# Patient Record
Sex: Female | Born: 1960 | Race: Black or African American | Hispanic: No | Marital: Married | Smoking: Never smoker
Health system: Southern US, Community
[De-identification: ages and names within clinical notes are randomized; demographics above are authoritative.]

## PROBLEM LIST (undated history)

## (undated) DIAGNOSIS — J45909 Unspecified asthma, uncomplicated: Secondary | ICD-10-CM

## (undated) DIAGNOSIS — K635 Polyp of colon: Secondary | ICD-10-CM

## (undated) DIAGNOSIS — C801 Malignant (primary) neoplasm, unspecified: Secondary | ICD-10-CM

## (undated) DIAGNOSIS — E559 Vitamin D deficiency, unspecified: Secondary | ICD-10-CM

## (undated) DIAGNOSIS — H209 Unspecified iridocyclitis: Secondary | ICD-10-CM

## (undated) HISTORY — PX: KNEE SURGERY: SHX244

## (undated) HISTORY — PX: ABDOMINAL HYSTERECTOMY: SHX81

## (undated) HISTORY — DX: Unspecified asthma, uncomplicated: J45.909

## (undated) HISTORY — DX: Unspecified iridocyclitis: H20.9

## (undated) HISTORY — DX: Polyp of colon: K63.5

## (undated) HISTORY — DX: Malignant (primary) neoplasm, unspecified: C80.1

## (undated) HISTORY — DX: Vitamin D deficiency, unspecified: E55.9

---

## 2000-11-12 ENCOUNTER — Other Ambulatory Visit: Admission: RE | Admit: 2000-11-12 | Discharge: 2000-11-12 | Payer: Self-pay | Admitting: Obstetrics and Gynecology

## 2001-08-29 ENCOUNTER — Encounter: Admission: RE | Admit: 2001-08-29 | Discharge: 2001-08-29 | Payer: Self-pay | Admitting: Urology

## 2001-08-29 ENCOUNTER — Encounter: Payer: Self-pay | Admitting: Urology

## 2003-07-28 ENCOUNTER — Other Ambulatory Visit: Admission: RE | Admit: 2003-07-28 | Discharge: 2003-07-28 | Payer: Self-pay | Admitting: Obstetrics and Gynecology

## 2004-04-14 ENCOUNTER — Encounter (INDEPENDENT_AMBULATORY_CARE_PROVIDER_SITE_OTHER): Payer: Self-pay | Admitting: Specialist

## 2004-04-15 ENCOUNTER — Inpatient Hospital Stay (HOSPITAL_COMMUNITY): Admission: RE | Admit: 2004-04-15 | Discharge: 2004-04-17 | Payer: Self-pay | Admitting: Obstetrics and Gynecology

## 2004-10-11 ENCOUNTER — Ambulatory Visit (HOSPITAL_COMMUNITY): Admission: RE | Admit: 2004-10-11 | Discharge: 2004-10-11 | Payer: Self-pay | Admitting: Obstetrics and Gynecology

## 2005-07-05 ENCOUNTER — Other Ambulatory Visit: Admission: RE | Admit: 2005-07-05 | Discharge: 2005-07-05 | Payer: Self-pay | Admitting: Obstetrics and Gynecology

## 2005-11-21 ENCOUNTER — Ambulatory Visit (HOSPITAL_COMMUNITY): Admission: RE | Admit: 2005-11-21 | Discharge: 2005-11-21 | Payer: Self-pay | Admitting: Obstetrics and Gynecology

## 2005-12-13 ENCOUNTER — Encounter: Admission: RE | Admit: 2005-12-13 | Discharge: 2005-12-13 | Payer: Self-pay | Admitting: Obstetrics and Gynecology

## 2006-07-30 ENCOUNTER — Encounter: Admission: RE | Admit: 2006-07-30 | Discharge: 2006-07-30 | Payer: Self-pay | Admitting: Obstetrics and Gynecology

## 2006-07-31 ENCOUNTER — Encounter (INDEPENDENT_AMBULATORY_CARE_PROVIDER_SITE_OTHER): Payer: Self-pay | Admitting: Specialist

## 2006-07-31 ENCOUNTER — Encounter: Admission: RE | Admit: 2006-07-31 | Discharge: 2006-07-31 | Payer: Self-pay | Admitting: Obstetrics and Gynecology

## 2006-08-22 ENCOUNTER — Encounter: Admission: RE | Admit: 2006-08-22 | Discharge: 2006-08-22 | Payer: Self-pay | Admitting: General Surgery

## 2006-08-22 ENCOUNTER — Encounter (INDEPENDENT_AMBULATORY_CARE_PROVIDER_SITE_OTHER): Payer: Self-pay | Admitting: Specialist

## 2006-08-22 ENCOUNTER — Ambulatory Visit (HOSPITAL_BASED_OUTPATIENT_CLINIC_OR_DEPARTMENT_OTHER): Admission: RE | Admit: 2006-08-22 | Discharge: 2006-08-22 | Payer: Self-pay | Admitting: General Surgery

## 2006-09-05 ENCOUNTER — Other Ambulatory Visit: Admission: RE | Admit: 2006-09-05 | Discharge: 2006-09-05 | Payer: Self-pay | Admitting: Obstetrics and Gynecology

## 2007-06-19 ENCOUNTER — Encounter: Admission: RE | Admit: 2007-06-19 | Discharge: 2007-06-19 | Payer: Self-pay | Admitting: Obstetrics and Gynecology

## 2008-07-27 ENCOUNTER — Ambulatory Visit (HOSPITAL_COMMUNITY): Admission: RE | Admit: 2008-07-27 | Discharge: 2008-07-27 | Payer: Self-pay | Admitting: Obstetrics and Gynecology

## 2009-07-29 ENCOUNTER — Ambulatory Visit (HOSPITAL_COMMUNITY): Admission: RE | Admit: 2009-07-29 | Discharge: 2009-07-29 | Payer: Self-pay | Admitting: Obstetrics and Gynecology

## 2010-09-20 ENCOUNTER — Ambulatory Visit (HOSPITAL_COMMUNITY): Admission: RE | Admit: 2010-09-20 | Discharge: 2010-09-20 | Payer: Self-pay | Admitting: Obstetrics and Gynecology

## 2011-01-09 ENCOUNTER — Encounter: Payer: Self-pay | Admitting: Obstetrics and Gynecology

## 2011-05-05 NOTE — H&P (Signed)
Danielle Blackburn, Danielle Blackburn                          ACCOUNT NO.:  [id number]   MEDICAL RECORD NO.:  [id number]                   PATIENT TYPE:  INP   LOCATION:  NA                                   FACILITY:  [facility]   PHYSICIAN:  Janine Limbo, M.D.            DATE OF BIRTH:  [birth year]   DATE OF ADMISSION:  04/14/2005  DATE OF DISCHARGE:                                HISTORY & PHYSICAL   HISTORY OF PRESENT ILLNESS:  Danielle Blackburn is a 50 year old female, para 2-0-1-2,  who presents for a vaginal hysterectomy. The patient complains of increasing  dysmenorrhea that is not relieved by oral contraceptives nor nonsteroidal  anti-inflammatory agents. The patient has had an ultrasound performed that  showed a 9-cm fundal fibroid. She was also found to have a 1.4-cm fibroid  present on the posterior fundus. Her ovaries appeared normal. The patient  denies irregular bleeding of any sort. She is ready to proceed with  definitive therapy. She also desires permanent sterilization. The patient's  last menstrual period was April 09, 2004. Her most recent Pap smear was  within normal limits and it was from August 2004.   OBSTETRIC HISTORY:  The patient has had two term vaginal deliveries and one  first trimester miscarriage.   DRUG ALLERGIES:  No known drug allergies.   PAST MEDICAL HISTORY:  The patient has a distant history of asthma and she  uses an albuterol inhaler when she needs it. She has not been hospitalized  for many, many years. She did say she had pneumonia as a 50 year old. She  had her wisdom teeth removed many years ago. She also had surgery on her  knee many years ago. She had a dilatation and curettage associated with the  first trimester pregnancy loss.   SOCIAL HISTORY:  The patient denies cigarette use, alcohol use, and  recreational drug use.   REVIEW OF SYSTEMS:  Please see history of present illness.   FAMILY HISTORY:  The patient's father died from congestive heart  failure.  Her mother had hypertension. The patient has two sisters with hypertension  and one sister with diabetes. She has one brother with hypertension, one  brother with asthma, and one brother that died from heart disease.   PHYSICAL EXAMINATION:  VITAL SIGNS: Weight is 192 pounds.  HEENT: Within normal limits.  CHEST: Clear.  HEART: Regular rate and rhythm.  BREASTS: Without masses.  ABDOMEN: Nontender.  EXTREMITIES: Grossly normal.  NEUROLOGIC: Grossly normal.  PELVIC EXAM: External genitalia is normal. The vagina is normal except for a  small cystocele and rectocele. The uterus is 8-10 week's size. The cervix  descends one-half the length of the vagina. The adnexa are without masses.  Rectovaginal exam confirms.   ASSESSMENT:  1. Fibroid uterus.  2. Dysmenorrhea unresponsive to birth control pills and nonsteroidal anti-     inflammatory agents.  3. Uterine descensus.  PLAN:  The patient will undergo a vaginal hysterectomy. She understands the  indications for her procedure as well as her alternative treatments. She  accepts the risks of, but not limited to anesthetic complications, bleeding,  infection, and possible damage to the surrounding organs.                                               Janine Limbo, M.D.    AVS/MEDQ  D:  04/10/2004  T:  04/10/2004  Job:  [id number]

## 2011-05-05 NOTE — Op Note (Signed)
Danielle Blackburn, Danielle Blackburn                ACCOUNT NO.:  [id number]   MEDICAL RECORD NO.:  [id number]          PATIENT TYPE:  AMB   LOCATION:  DSC                          FACILITY:  [facility]   PHYSICIAN:  Leonie Man, M.D.   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   POSTOPERATIVE DIAGNOSIS:  Atypical ductal hyperplasia, left breast, rule out  carcinoma, pathology pending.   PROCEDURE:  Needle localization with lumpectomy, left breast.   SURGEON:  Leonie Man, M.D.   ASSISTANT:  OR nurse.   ANESTHESIA:  General.   TISSUES TO THE LAB:  1. Breast tissue with localizing wire.  2. Additional lateral margin.  3. Additional medial margin.   ESTIMATED BLOOD LOSS:  Minimal.   COMPLICATIONS:  None.   DISPOSITION:  The patient returned to the PACU in excellent condition.   NOTE:  [name] is a 50 year old female on routine mammogram noted to have  an area of pleomorphic calcifications in the right breast.  This was  biopsied and noted to have atypical ductal hyperplasia, without any evidence  of carcinoma in situ.  The patient comes to the operating room now for more  precise excision of this are of ADH.  After the risks and potential benefits  of surgery had been discussed, all questions were answered and consent  obtained.   DESCRIPTION OF PROCEDURE:  The patient was positioned supinely.  Following  the induction of satisfactory general anesthesia, the left breast was  prepped and draped and included in a sterile operative field.  A  circumareolar incision was carried down through the skin, subcutaneous  tissue and a nipple flap raised medially to follow the course of the  localizing needle.  The localizing needle was brought into the wound.  There  was some thick scarring underneath the nipple, and  this was dissected down  upon.  A core of breast tissue approximately 6 cm in length was taken out  from around the localizing needle, and this was forwarded for radiologic  evaluation.  On initial radiologic evaluation, the clip was not seen.  I  then returned to the incision and took additional medial and lateral wall,  labeled #2 and 3, for additional evaluation.  Hemostasis was then obtained  with electrocautery.  Sponge and instrument counts verified.  I did not  think it necessary to simply continue taking out breast tissue in view of  this benign diagnosis, and I will wait for the permanent sections, at such  time as to whether or not there is any evidence of DCIS within this area.  The wounds were then closed in layers as follows.  The breast tissue was  reapproximated with 2-0  Vicryl sutures.  The subcutaneous tissue was closed with 3-0 Vicryl.  The  skin was closed with 5-0 Vicryl, and then reinforced with Steri-Strips.  A  sterile compressive dressing was applied, the anesthetic reversed, and the  patient removed from the operating room to the recovery room in stable  condition.  She tolerated the procedure well.      Leonie Man, M.D.  Electronically Signed     PB/MEDQ  D:  08/22/2006  T:  08/22/2006  Job:  [id number]   cc:   Leonie Man, MD

## 2011-05-05 NOTE — Op Note (Signed)
Danielle Blackburn, Danielle Blackburn                          ACCOUNT NO.:  [id number]   MEDICAL RECORD NO.:  [id number]                   PATIENT TYPE:  OBV   LOCATION:  9305                                 FACILITY:  [facility]   PHYSICIAN:  Janine Limbo, M.D.            DATE OF BIRTH:  September 09, 1961   DATE OF PROCEDURE:  04/14/2004  DATE OF DISCHARGE:                                 OPERATIVE REPORT   PREOPERATIVE DIAGNOSIS:  1. Fibroid uterus.  2. Dysmenorrhea.  3. Uterine descensus.   POSTOPERATIVE DIAGNOSIS:  1. Fibroid uterus.  2. Dysmenorrhea.  3. Uterine descensus.   PROCEDURE:  1. Vaginal hysterectomy.  2. Uterine morcellation.  3. Cystoscopy.   SURGEON:  Janine Limbo, M.D.   FIRST ASSISTANT:   ANESTHESIA:  General.   DISPOSITION:  Danielle Blackburn is a 50 year old female who presents with a fibroid  uterus and dysmenorrhea.  Her dysmenorrhea has been unresponsive to  nonsteroidal anti-inflammatory agents and to oral contraceptives.  She  understands the indications for her procedure and she accepts the risks of,  but not limited to, anesthetic complications, bleeding, infection, and  possible damage to surrounding organs.   FINDINGS:  The patient had a 14-15 weeks size fibroid uterus.  The fallopian  tubes and the ovaries appeared normal.  Cystoscopy was performed at the end  of the procedure because of the extensive uterine morcellation that was  required and because sutures were required for hemostasis at the sidewall.  The patient was noted to have patent ureters bilaterally.   PROCEDURE:  The patient was taken to the operating room where a general  anesthetic was given.  The patient's abdomen, perineum, and vagina were  prepped with multiple areas of Betadine.  A Foley catheter was placed in the  bladder.  The patient was sterilely draped.  Examination under anesthesia  was performed.  The cervix was then injected with 30 mL of a dilute solution  of Pitressin and saline.   A circumferential incision was made around the  cervix.  The mucosa was advanced anteriorly and posteriorly.  The posterior  cul-de-sac and then the anterior cul-de-sac were entered.  Alternating from  right to left, the uterosacral ligaments, paracervical tissues, parametrial  tissues, and the uterine arteries were clamped, cut, sutured, and tied  securely.  Attempts were made to invert the uterus through the posterior  colpotomy, but these attempts were unsuccessful.  After multiple attempts we  then began to morcellate the uterus so that it could be removed through the  vagina.  Brisk bleeding was encountered.  Large sections of myometrium and  fibroids were removed.  We were able to finally invert the uterus through  the posterior colpotomy.  The upper pedicles were secured using clamps.  The  uterus was transected from the operative field.  Bleeding was noted on the  sidewalls.  Hemostasis was achieved using figure-of-eight sutures.  Care was  taken not to damage the vital structures of the pelvic sidewall.  Once we  were finally able to obtain hemostasis, the sutures attached to the  uterosacral ligaments were brought out through the vaginal angles and tied  securely.  A McCall culdoplasty suture was placed in the posterior cul-de-  sac incorporating the uterosacral ligaments bilaterally and the posterior  peritoneum.  A final check was made for hemostasis and, again, hemostasis  was confirmed.  The vaginal cuff was then closed using figure-of-eight  sutures incorporating the anterior vaginal mucosa, the anterior peritoneum,  the posterior peritoneum, and the posterior vaginal mucosa.  The McCall  culdoplasty suture was tied securely and the apex of the vagina was noted to  elevate into the mid pelvis.  The Foley catheter was removed and the  diagnostic hysteroscope was inserted.  The patient was given methylene blue.  We then identified the ureteral orifices.  Peristalsis was noted at  the  ureteral orifices bilaterally and fluid was noted to pass through both  ureteral openings.  The bladder mucosa was carefully inspected and there  were no lesions present.  No sutures were identified.  The cystoscope was  removed and the Foley catheter was again reinserted.  The vagina was then  packed using gauze that had been prepped with Estrogen cream.  The patient  was awakened from her anesthetic and taken to the recovery room in stable  condition.  0 Vicryl was the suture material used throughout the procedure.  Sponge, needle, and instrument counts were correct on two occasions.  The  patient tolerated the procedure well.  The patient was noted to drain clear  urine that was slightly tinged with blue dye.  The estimated blood loss was  1650 mL.                                               Janine Limbo, M.D.    AVS/MEDQ  D:  04/14/2004  T:  04/14/2004  Job:  [id number]

## 2011-05-05 NOTE — Discharge Summary (Signed)
Danielle Blackburn, Danielle Blackburn                          ACCOUNT NO.:  [id number]   MEDICAL RECORD NO.:  [id number]                   PATIENT TYPE:  INP   LOCATION:  9305                                 FACILITY:  [facility]   PHYSICIAN:  Janine Limbo, M.D.            DATE OF BIRTH:  01-14-1961   DATE OF ADMISSION:  04/14/2004  DATE OF DISCHARGE:  04/17/2004                                 DISCHARGE SUMMARY   DISCHARGE DIAGNOSES:  1. Fibroid uterus.  2. Dysmenorrhea unresponsive to oral contraceptives and nonsteroidal     antiinflammatory medication.  3. Uterine descensus.   OPERATION:  On the date of admission the patient underwent a total vaginal  hysterectomy with uterine morcellation and cystoscopy, tolerating all  procedures well.  The patient was found to have a fibroid uterus which  weighed 487 g with normal-appearing tubes and ovaries and patent ureters  bilaterally.   HISTORY OF PRESENT ILLNESS:  Danielle Blackburn is a 50 year old female para 2-0-1-2  who presents for vaginal hysterectomy because of symptomatic uterine  fibroids.  Please see the patient's dictated History and Physical  Examination for details.   PHYSICAL EXAMINATION:  VITAL SIGNS:  Weight is 192 pounds.  GENERAL:  Within normal limits.  PELVIC:  External genitalia is normal.  Vagina is normal except for a small  cystocele and rectocele.  The uterus is 8- to 10-week size.  The cervix  descends over one-half the length of the vagina.  Adnexa are without masses  and rectovaginal exam confirms.   HOSPITAL COURSE:  On the date of admission the patient underwent  aforementioned procedures, tolerating them all well.  Postoperative course  was marked by a spike in temperature to 100.8 degrees Fahrenheit orally.  However, the patient quickly defervesced and remained afebrile for 24 hours  prior to discharge.  Postoperative hemoglobin (immediately) was 9.0, and  postoperative day #1 was 8.3.  The patient's preoperative hemoglobin  was  12.2.  The patient was offered transfusion for her mildly symptomatic  anemia; however, she declined.  By postoperative day #4 the patient had  resumed bowel and bladder function and was therefore deemed ready for  discharge home.   DISCHARGE MEDICATIONS:  1. Phenergan 25 mg one tablet q.6h. as needed for nausea.  2. Ibuprofen 600 mg one tablet with food q.6h. for 5 days then as needed for     pain.  3. Colace 100 mg one tablet twice daily until bowel movements are regular.  4. Iron 325 mg one tablet twice daily for 6 weeks.  5. Vicodin one to two tablets q.4-6h. as needed for pain.   FOLLOW-UP:  The patient is scheduled for a 6 weeks postoperative visit with  Dr. Stefano Gaul on May 26, 2004 at 10:30 a.m.   DISCHARGE INSTRUCTIONS:  1. The patient was given a copy of [facility] postoperative     instruction sheet.  2. She was  further advised to avoid driving for 2 weeks, heavy lifting for 4     weeks, and intercourse for 6 weeks.  3. The patient's diet was without restriction.   FINAL PATHOLOGY:  Uterus hysterectomy:  Benign cervix with chronic  cervicitis and squamous metaplasia, benign early secretory endometrium,  leiomyomata, serosal fibroids adhesions.     Danielle Blackburn.                    Janine Limbo, M.D.    EJP/MEDQ  D:  04/27/2004  T:  04/27/2004  Job:  [id number]

## 2011-08-23 ENCOUNTER — Other Ambulatory Visit (HOSPITAL_COMMUNITY): Payer: Self-pay | Admitting: Obstetrics and Gynecology

## 2011-08-23 DIAGNOSIS — Z1231 Encounter for screening mammogram for malignant neoplasm of breast: Secondary | ICD-10-CM

## 2011-09-28 ENCOUNTER — Ambulatory Visit (HOSPITAL_COMMUNITY)
Admission: RE | Admit: 2011-09-28 | Discharge: 2011-09-28 | Disposition: A | Discharge status: Home / Self Care | Payer: BC Managed Care – PPO | Source: Ambulatory Visit | Attending: Obstetrics and Gynecology | Admitting: Obstetrics and Gynecology

## 2011-09-28 DIAGNOSIS — Z1231 Encounter for screening mammogram for malignant neoplasm of breast: Secondary | ICD-10-CM

## 2011-09-29 ENCOUNTER — Other Ambulatory Visit: Payer: Self-pay | Admitting: Family Medicine

## 2011-09-29 DIAGNOSIS — N63 Unspecified lump in unspecified breast: Secondary | ICD-10-CM

## 2011-10-13 ENCOUNTER — Ambulatory Visit
Admission: RE | Admit: 2011-10-13 | Discharge: 2011-10-13 | Disposition: A | Discharge status: Home / Self Care | Payer: BC Managed Care – PPO | Source: Ambulatory Visit | Attending: Family Medicine | Admitting: Family Medicine

## 2011-10-13 DIAGNOSIS — N63 Unspecified lump in unspecified breast: Secondary | ICD-10-CM

## 2011-11-24 LAB — HM COLONOSCOPY

## 2012-03-26 LAB — HM PAP SMEAR

## 2012-09-03 ENCOUNTER — Other Ambulatory Visit: Payer: Self-pay | Admitting: Family Medicine

## 2012-09-03 DIAGNOSIS — Z1231 Encounter for screening mammogram for malignant neoplasm of breast: Secondary | ICD-10-CM

## 2012-10-14 ENCOUNTER — Ambulatory Visit
Admission: RE | Admit: 2012-10-14 | Discharge: 2012-10-14 | Disposition: A | Discharge status: Home / Self Care | Payer: BC Managed Care – PPO | Source: Ambulatory Visit | Attending: Family Medicine | Admitting: Family Medicine

## 2012-10-14 DIAGNOSIS — Z1231 Encounter for screening mammogram for malignant neoplasm of breast: Secondary | ICD-10-CM

## 2012-10-14 LAB — HM MAMMOGRAPHY

## 2013-04-01 ENCOUNTER — Telehealth: Payer: Self-pay | Admitting: *Deleted

## 2013-04-01 NOTE — Telephone Encounter (Signed)
PT HAD PHYS AND LABS 04/15/12 NEEDS FORM FILLED OUT STATING SHE HAD PHYS AND IS WITHIN YEAR RANGE. CAN SHE DROP THAT BY SO DR. CAN FILL OUT?

## 2013-04-02 NOTE — Telephone Encounter (Signed)
Yes.  Tell her to bring it by.  I'd complete it and have Dr Herma Carson sign for it. Thanks PG

## 2013-09-17 ENCOUNTER — Other Ambulatory Visit: Payer: Self-pay

## 2013-09-17 DIAGNOSIS — Z1231 Encounter for screening mammogram for malignant neoplasm of breast: Secondary | ICD-10-CM

## 2013-10-15 ENCOUNTER — Ambulatory Visit
Admission: RE | Admit: 2013-10-15 | Discharge: 2013-10-15 | Disposition: A | Discharge status: Home / Self Care | Payer: BC Managed Care – PPO | Source: Ambulatory Visit

## 2013-10-15 DIAGNOSIS — Z1231 Encounter for screening mammogram for malignant neoplasm of breast: Secondary | ICD-10-CM

## 2014-01-06 ENCOUNTER — Other Ambulatory Visit: Payer: Self-pay | Admitting: *Deleted

## 2014-01-06 DIAGNOSIS — E785 Hyperlipidemia, unspecified: Secondary | ICD-10-CM

## 2014-01-06 DIAGNOSIS — E559 Vitamin D deficiency, unspecified: Secondary | ICD-10-CM

## 2014-01-06 DIAGNOSIS — Z0000 Encounter for general adult medical examination without abnormal findings: Secondary | ICD-10-CM

## 2014-01-07 ENCOUNTER — Other Ambulatory Visit: Payer: BC Managed Care – PPO

## 2014-01-07 LAB — CBC WITH DIFFERENTIAL/PLATELET
Basophils Absolute: 0 10*3/uL (ref 0.0–0.1)
Basophils Relative: 0 % (ref 0–1)
Eosinophils Absolute: 0.1 10*3/uL (ref 0.0–0.7)
Eosinophils Relative: 2 % (ref 0–5)
HCT: 38.1 % (ref 36.0–46.0)
Hemoglobin: 12.5 g/dL (ref 12.0–15.0)
Lymphocytes Relative: 29 % (ref 12–46)
Lymphs Abs: 1.6 10*3/uL (ref 0.7–4.0)
MCH: 26.8 pg (ref 26.0–34.0)
MCHC: 32.8 g/dL (ref 30.0–36.0)
MCV: 81.8 fL (ref 78.0–100.0)
Monocytes Absolute: 0.3 10*3/uL (ref 0.1–1.0)
Monocytes Relative: 6 % (ref 3–12)
Neutro Abs: 3.5 10*3/uL (ref 1.7–7.7)
Neutrophils Relative %: 63 % (ref 43–77)
Platelets: 307 10*3/uL (ref 150–400)
RBC: 4.66 MIL/uL (ref 3.87–5.11)
RDW: 15.1 % (ref 11.5–15.5)
WBC: 5.5 10*3/uL (ref 4.0–10.5)

## 2014-01-08 LAB — VITAMIN D 25 HYDROXY (VIT D DEFICIENCY, FRACTURES): Vit D, 25-Hydroxy: 23 ng/mL — ABNORMAL LOW (ref 30–89)

## 2014-01-08 LAB — COMPLETE METABOLIC PANEL WITH GFR
ALT: 11 U/L (ref 0–35)
AST: 14 U/L (ref 0–37)
Albumin: 4 g/dL (ref 3.5–5.2)
Alkaline Phosphatase: 62 U/L (ref 39–117)
BUN: 11 mg/dL (ref 6–23)
CO2: 30 mEq/L (ref 19–32)
Calcium: 9.7 mg/dL (ref 8.4–10.5)
Chloride: 102 mEq/L (ref 96–112)
Creat: 0.66 mg/dL (ref 0.50–1.10)
GFR, Est African American: >89 mL/min
GFR, Est Non African American: >89 mL/min
Glucose, Bld: 93 mg/dL (ref 70–99)
Potassium: 4.3 mEq/L (ref 3.5–5.3)
Sodium: 138 mEq/L (ref 135–145)
Total Bilirubin: 0.5 mg/dL (ref 0.3–1.2)
Total Protein: 7 g/dL (ref 6.0–8.3)

## 2014-01-08 LAB — LIPID PANEL
Cholesterol: 203 mg/dL — ABNORMAL HIGH (ref 0–200)
HDL: 96 mg/dL (ref >39)
LDL Cholesterol: 96 mg/dL (ref 0–99)
Total CHOL/HDL Ratio: 2.1 Ratio
Triglycerides: 57 mg/dL (ref <150)
VLDL: 11 mg/dL (ref 0–40)

## 2014-01-08 LAB — TSH: TSH: 2.1 u[IU]/mL (ref 0.350–4.500)

## 2014-01-09 ENCOUNTER — Encounter: Payer: Self-pay | Admitting: *Deleted

## 2014-01-09 DIAGNOSIS — J45909 Unspecified asthma, uncomplicated: Secondary | ICD-10-CM | POA: Insufficient documentation

## 2014-01-09 DIAGNOSIS — K625 Hemorrhage of anus and rectum: Secondary | ICD-10-CM | POA: Insufficient documentation

## 2014-01-09 DIAGNOSIS — H2189 Other specified disorders of iris and ciliary body: Secondary | ICD-10-CM | POA: Insufficient documentation

## 2014-01-09 DIAGNOSIS — Z860100 Personal history of colon polyps, unspecified: Secondary | ICD-10-CM | POA: Insufficient documentation

## 2014-01-19 ENCOUNTER — Encounter: Payer: Self-pay | Admitting: Family Medicine

## 2014-01-19 ENCOUNTER — Ambulatory Visit (INDEPENDENT_AMBULATORY_CARE_PROVIDER_SITE_OTHER): Payer: BC Managed Care – PPO | Admitting: Family Medicine

## 2014-01-19 VITALS — BP 97/67 | HR 62 | Resp 16 | Ht 63.25 in | Wt 190.0 lb

## 2014-01-19 DIAGNOSIS — R062 Wheezing: Secondary | ICD-10-CM

## 2014-01-19 DIAGNOSIS — E559 Vitamin D deficiency, unspecified: Secondary | ICD-10-CM

## 2014-01-19 MED ORDER — VITAMIN D (ERGOCALCIFEROL) 1.25 MG (50000 UNIT) PO CAPS: 50000.0000 [IU] | ORAL_CAPSULE | ORAL | Status: DC

## 2014-01-19 MED ORDER — ALBUTEROL SULFATE HFA 108 (90 BASE) MCG/ACT IN AERS: 2.0000 | INHALATION_SPRAY | Freq: Four times a day (QID) | RESPIRATORY_TRACT | Status: DC | PRN

## 2014-01-19 NOTE — Patient Instructions (Signed)
1)  Vaccinations - MMR (Measles, Mumps Rubella); Zostavax (Check with your insurance)  2)  Call Dr. Guinevere Scarlet office and confirm when they want to do another colonoscopy.  If they say 3 years, that would be this December.  If they say 5 years then I would recommend doing stool cards.    3)  Vitamin D Deficiency - Take your Vitamin D twice a week (Sun/Wed).  Remember that Vitamin D is to help keep bones strong and decrease chance of cancer .    Vitamin D Deficiency Vitamin D is an important vitamin that your body needs. Having too little of it in your body is called a deficiency. A very bad deficiency can make your bones soft and can cause a condition called rickets.  Vitamin D is important to your body for different reasons, such as:   It helps your body absorb 2 minerals called calcium and phosphorus.  It helps make your bones healthy.  It may prevent some diseases, such as diabetes and multiple sclerosis.  It helps your muscles and heart. You can get vitamin D in several ways. It is a natural part of some foods. The vitamin is also added to some dairy products and cereals. Some people take vitamin D supplements. Also, your body makes vitamin D when you are in the sun. It changes the sun's rays into a form of the vitamin that your body can use. CAUSES   Not eating enough foods that contain vitamin D.  Not getting enough sunlight.  Having certain digestive system diseases that make it hard to absorb vitamin D. These diseases include Crohn's disease, chronic pancreatitis, and cystic fibrosis.  Having a surgery in which part of the stomach or small intestine is removed.  Being obese. Fat cells pull vitamin D out of your blood. That means that obese people may not have enough vitamin D left in their blood and in other body tissues.  Having chronic kidney or liver disease. RISK FACTORS Risk factors are things that make you more likely to develop a vitamin D deficiency. They  include:  Being older.  Not being able to get outside very much.  Living in a nursing home.  Having had broken bones.  Having weak or thin bones (osteoporosis).  Having a disease or condition that changes how your body absorbs vitamin D.  Having dark skin.  Some medicines such as seizure medicines or steroids.  Being overweight or obese. SYMPTOMS Mild cases of vitamin D deficiency may not have any symptoms. If you have a very bad case, symptoms may include:  Bone pain.  Muscle pain.  Falling often.  Broken bones caused by a minor injury, due to osteoporosis. DIAGNOSIS A blood test is the best way to tell if you have a vitamin D deficiency. TREATMENT Vitamin D deficiency can be treated in different ways. Treatment for vitamin D deficiency depends on what is causing it. Options include:  Taking vitamin D supplements.  Taking a calcium supplement. Your caregiver will suggest what dose is best for you. HOME CARE INSTRUCTIONS  Take any supplements that your caregiver prescribes. Follow the directions carefully. Take only the suggested amount.  Have your blood tested 2 months after you start taking supplements.  Eat foods that contain vitamin D. Healthy choices include:  Fortified dairy products, cereals, or juices. Fortified means vitamin D has been added to the food. Check the label on the package to be sure.  Fatty fish like salmon or trout.  Eggs.  Oysters.  Do not use a tanning bed.  Keep your weight at a healthy level. Lose weight if you need to.  Keep all follow-up appointments. Your caregiver will need to perform blood tests to make sure your vitamin D deficiency is going away. SEEK MEDICAL CARE IF:  You have any questions about your treatment.  You continue to have symptoms of vitamin D deficiency.  You have nausea or vomiting.  You are constipated.  You feel confused.  You have severe abdominal or back pain. MAKE SURE YOU:  Understand these  instructions.  Will watch your condition.  Will get help right away if you are not doing well or get worse. Document Released: 02/26/2012 Document Revised: 03/31/2013 Document Reviewed: 02/26/2012 [facility] Patient Information 2014 [place].

## 2014-01-19 NOTE — Progress Notes (Signed)
   Subjective:    Patient ID: Danielle Blackburn, female    DOB: [birth year], 53 y.o.   MRN: [id number]  HPI  Evanna is here today to go over her most recent lab results.  She has done well since her last office visit.  She does not have any medical concerns today but does need to have her albuterol inhaler refilled.      Review of Systems  Constitutional: Negative for activity change, fatigue and unexpected weight change.  HENT: Negative.   Eyes: Negative.   Respiratory: Negative for shortness of breath.   Cardiovascular: Negative for chest pain, palpitations and leg swelling.  Gastrointestinal: Negative for diarrhea and constipation.  Endocrine: Negative.   Genitourinary: Negative for difficulty urinating.  Musculoskeletal: Negative.   Skin: Negative.   Neurological: Negative.   Hematological: Negative for adenopathy. Does not bruise/bleed easily.  Psychiatric/Behavioral: Negative for sleep disturbance and dysphoric mood. The patient is not nervous/anxious.   All other systems reviewed and are negative.     Past Medical History  Diagnosis Date  . Cancer     Rectal Cancer (Well-Differentiated Adenocarcinoma) T1N0M0   . Colon polyps   . Asthma   . Vitamin D deficiency   . Iritis      Past Surgical History  Procedure Laterality Date  . Abdominal hysterectomy    . Knee surgery Right      History   Social History Narrative   Marital Status: Married Engineering geologist)   Children:  G3 P1/0/2/2   Lauren (35) Hart Carwin (13)   Pets: None    Living Situation: Lives with  spouse and children   Occupation: Medical sales representative -  Production designer, theatre/television/film    Education:  Forensic psychologist   Tobacco Use/Exposure:  None    Alcohol Use:  None   Drug Use:  None   Diet:  [employer]   Exercise:  [employer], Walking   Hobbies: Shopping     Family History  Problem Relation Age of Onset  . Hypertension Mother   . Diabetes Sister   . Heart disease Brother   . Hypertension Maternal  Grandmother      No Known Allergies   Immunization History  Administered Date(s) Administered  . Tdap 02/08/2009        Objective:   Physical Exam  Vitals reviewed. Constitutional: She is oriented to person, place, and time.  Eyes: Conjunctivae are normal. No scleral icterus.  Neck: Neck supple. No thyromegaly present.  Cardiovascular: Normal rate, regular rhythm and normal heart sounds.   Pulmonary/Chest: Effort normal and breath sounds normal.  Musculoskeletal: She exhibits no edema and no tenderness.  Lymphadenopathy:    She has no cervical adenopathy.  Neurological: She is alert and oriented to person, place, and time.  Skin: Skin is warm and dry.  Psychiatric: She has a normal mood and affect. Her behavior is normal. Judgment and thought content normal.       Assessment & Plan:    Deneise was seen today for lab results.  Diagnoses and associated orders for this visit:  Wheezing - albuterol (PROVENTIL HFA;VENTOLIN HFA) 108 (90 BASE) MCG/ACT inhaler; Inhale 2 puffs into the lungs every 6 (six) hours as needed for wheezing or shortness of breath (cough, shortness of breath or wheezing.).  Unspecified vitamin D deficiency - Vitamin D, Ergocalciferol, (DRISDOL) 50000 UNITS CAPS capsule; Take 1 capsule (50,000 Units total) by mouth 2 (two) times a week.

## 2014-02-19 ENCOUNTER — Other Ambulatory Visit: Payer: BC Managed Care – PPO | Admitting: Family Medicine

## 2014-03-13 ENCOUNTER — Encounter (INDEPENDENT_AMBULATORY_CARE_PROVIDER_SITE_OTHER): Payer: Self-pay

## 2014-03-13 ENCOUNTER — Ambulatory Visit (INDEPENDENT_AMBULATORY_CARE_PROVIDER_SITE_OTHER): Payer: BC Managed Care – PPO | Admitting: Family Medicine

## 2014-03-13 ENCOUNTER — Encounter: Payer: Self-pay | Admitting: Family Medicine

## 2014-03-13 VITALS — BP 103/70 | HR 80 | Resp 16 | Ht 63.25 in | Wt 194.0 lb

## 2014-03-13 DIAGNOSIS — E669 Obesity, unspecified: Secondary | ICD-10-CM

## 2014-03-13 DIAGNOSIS — Z0000 Encounter for general adult medical examination without abnormal findings: Secondary | ICD-10-CM

## 2014-03-13 LAB — POCT URINALYSIS DIPSTICK
Bilirubin, UA: NEGATIVE
Blood, UA: NEGATIVE
Glucose, UA: NEGATIVE
Ketones, UA: NEGATIVE
Leukocytes, UA: NEGATIVE
Nitrite, UA: NEGATIVE
Protein, UA: NEGATIVE
Spec Grav, UA: 1.02
Urobilinogen, UA: NEGATIVE
pH, UA: 6.5

## 2014-03-13 MED ORDER — PHENDIMETRAZINE TARTRATE 35 MG PO TABS: 1.0000 | ORAL_TABLET | Freq: Three times a day (TID) | ORAL | Status: DC

## 2014-03-13 NOTE — Progress Notes (Signed)
Subjective:    Patient ID: Danielle Blackburn, female    DOB: 09-22-1961, 53 y.o.   MRN: [id number]  HPI  Ziyah is here today for her annual CPE.  She has done well since her last office visit.  Her only concern is her weight.     Review of Systems  Constitutional: Negative for activity change, appetite change, fatigue and unexpected weight change.  HENT: Negative for congestion, dental problem, ear pain, hearing loss, trouble swallowing and voice change.   Eyes: Negative for pain, redness and visual disturbance.  Respiratory: Negative for cough and shortness of breath.   Cardiovascular: Negative for chest pain, palpitations and leg swelling.  Gastrointestinal: Negative for nausea, vomiting, abdominal pain, diarrhea, constipation and blood in stool.  Endocrine: Negative for cold intolerance, heat intolerance, polydipsia, polyphagia and polyuria.  Genitourinary: Negative for dysuria, urgency, frequency, hematuria, vaginal discharge and pelvic pain.  Musculoskeletal: Negative for arthralgias, back pain, joint swelling, myalgias and neck pain.  Skin: Negative for rash.  Neurological: Negative for dizziness, weakness and headaches.  Hematological: Negative for adenopathy. Does not bruise/bleed easily.  Psychiatric/Behavioral: Negative for sleep disturbance, dysphoric mood and decreased concentration. The patient is not nervous/anxious.      Past Medical History  Diagnosis Date  . Cancer     Rectal Cancer (Well-Differentiated Adenocarcinoma) T1N0M0   . Colon polyps   . Asthma   . Vitamin D deficiency   . Iritis      Past Surgical History  Procedure Laterality Date  . Abdominal hysterectomy    . Knee surgery Right      History   Social History Narrative   Marital Status: Married Engineering geologist)   Children:  G3 P1/0/2/2   Lauren (54) Hart Carwin (13)   Pets: None    Living Situation: Lives with  spouse and children   Occupation: Medical sales representative -  Production designer, theatre/television/film    Education:  Forensic psychologist   Tobacco Use/Exposure:  None    Alcohol Use:  None   Drug Use:  None   Diet:  [employer]   Exercise:  [employer], Walking   Hobbies: Shopping     Family History  Problem Relation Age of Onset  . Hypertension Mother   . Diabetes Sister   . Heart disease Brother   . Hypertension Maternal Grandmother      Current Outpatient Prescriptions on File Prior to Visit  Medication Sig Dispense Refill  . Vitamin D, Ergocalciferol, (DRISDOL) 50000 UNITS CAPS capsule Take 1 capsule (50,000 Units total) by mouth 2 (two) times a week.  24 capsule  3  . albuterol (PROVENTIL HFA;VENTOLIN HFA) 108 (90 BASE) MCG/ACT inhaler Inhale 2 puffs into the lungs every 6 (six) hours as needed for wheezing or shortness of breath (cough, shortness of breath or wheezing.).  1 Inhaler  11   No current facility-administered medications on file prior to visit.     No Known Allergies   Immunization History  Administered Date(s) Administered  . Tdap 02/08/2009       Objective:   Physical Exam  Nursing note and vitals reviewed. Constitutional: She is oriented to person, place, and time. She appears well-developed and well-nourished. No distress.  HENT:  Head: Normocephalic and atraumatic.  Right Ear: External ear normal.  Left Ear: External ear normal.  Nose: Nose normal.  Mouth/Throat: Oropharynx is clear and moist.  Eyes: Conjunctivae and EOM are normal. Pupils are equal, round, and reactive to light. Right eye exhibits no discharge. Left  eye exhibits no discharge. No scleral icterus.  Neck: Normal range of motion. Neck supple. No thyromegaly present.  Cardiovascular: Normal rate, regular rhythm, normal heart sounds and intact distal pulses.  Exam reveals no gallop and no friction rub.   No murmur heard. Pulmonary/Chest: Effort normal and breath sounds normal. Right breast exhibits no inverted nipple, no mass, no nipple discharge, no skin change and no tenderness.  Left breast exhibits no inverted nipple, no mass, no nipple discharge, no skin change and no tenderness. Breasts are symmetrical.  Abdominal: Soft. Bowel sounds are normal. She exhibits no distension and no mass. There is no tenderness.  Musculoskeletal: Normal range of motion. She exhibits no edema and no tenderness.  Lymphadenopathy:    She has no cervical adenopathy.  Neurological: She is alert and oriented to person, place, and time. She has normal reflexes.  Skin: Skin is warm and dry. No rash noted.  Psychiatric: She has a normal mood and affect. Her behavior is normal. Judgment and thought content normal.      Assessment & Plan:    Maram was seen today for annual exam.  Diagnoses and associated orders for this visit:  Routine general medical examination at a health care facility - EKG 12-Lead - POCT urinalysis dipstick  Obesity, unspecified - Phendimetrazine Tartrate 35 MG TABS; Take 1 tablet (35 mg total) by mouth 3 (three) times daily before meals.

## 2014-03-13 NOTE — Patient Instructions (Signed)
1)  Weight Loss - To lose 20 lbs in 10 weeks you need to cut your current intake of calories by 1000.  Download My Fitness Pal and record your normal intake of food for 7 days and get an average for the week.  If you eat 2500 cal per day then you need to decrease to 1500 calories.  After trying the Fitness Pal if you feel that you need more motivation for your exercise you can consider a Fit Bit charge to help keep you motivated with your exercise.     High Protein Diet A high protein diet means that high protein foods are added to your diet. Getting more protein in the diet is important for a number of reasons. Protein helps the body to build tissue, muscle, and to repair damage. People who have had surgery, injuries such as broken bones, infections, and burns, or illnesses such as cancer, may need more protein in their diet.  SERVING SIZES Measuring foods and serving sizes helps to make sure you are getting the right amount of food. The list below tells how big or small some common serving sizes are.   1 oz.........4 stacked dice.  3 oz........Marland KitchenDeck of cards.  1 tsp.......Marland KitchenTip of little finger.  1 tbs......Marland KitchenMarland KitchenThumb.  2 tbs.......Marland KitchenGolf ball.   cup......Marland KitchenHalf of a fist.  1 cup.......Marland KitchenA fist. FOOD SOURCES OF PROTEIN Listed below are some food sources of protein and the amount of protein they contain. Your Registered Dietitian can calculate how many grams of protein you need for your medical condition. High protein foods can be added to the diet at mealtime or as snacks. Be sure to have at least 1 protein-containing food at each meal and snack to ensure adequate intake.  Meats and Meat Substitutes / Protein (g)  3 oz poultry (chicken, [place]) / 26 g  3 oz tuna, canned in water / 26 g  3 oz fish (cod) / 21 g  3 oz red meat (beef, pork) / 21 g  4 oz tofu / 9 g  1 egg / 6 g   cup egg substitute / 5 g  1 cup dried beans / 15 g  1 cup soy milk / 4 g Dairy / Protein (g)  1 cup  milk (skim, 1%, 2%, whole) / 8 g   cup evaporated milk / 9 g  1 cup buttermilk / 8 g  1 cup low-fat plain yogurt / 11 g  1 cup regular plain yogurt / 9 g   cup cottage cheese / 14 g  1 oz cheddar cheese / 7 g Nuts / Protein (g)  2 tbs peanut butter / 8 g  1 oz peanuts / 7 g  2 tbs cashews / 5 g  2 tbs almonds / 5 g Document Released: 12/04/2005 Document Revised: 02/26/2012 Document Reviewed: 09/06/2007 [employer] Patient Information 2014 [place].

## 2014-04-13 ENCOUNTER — Ambulatory Visit: Payer: BC Managed Care – PPO | Admitting: Family Medicine

## 2014-07-10 ENCOUNTER — Encounter: Payer: Self-pay | Admitting: Family Medicine

## 2014-07-10 ENCOUNTER — Ambulatory Visit (INDEPENDENT_AMBULATORY_CARE_PROVIDER_SITE_OTHER): Payer: BC Managed Care – PPO | Admitting: Family Medicine

## 2014-07-10 VITALS — BP 103/68 | HR 66 | Resp 16 | Ht 63.25 in | Wt 179.0 lb

## 2014-07-10 DIAGNOSIS — R062 Wheezing: Secondary | ICD-10-CM

## 2014-07-10 DIAGNOSIS — E559 Vitamin D deficiency, unspecified: Secondary | ICD-10-CM

## 2014-07-10 DIAGNOSIS — E669 Obesity, unspecified: Secondary | ICD-10-CM

## 2014-07-10 MED ORDER — VITAMIN D (ERGOCALCIFEROL) 1.25 MG (50000 UNIT) PO CAPS: 50000.0000 [IU] | ORAL_CAPSULE | ORAL | Status: AC

## 2014-07-10 MED ORDER — ALBUTEROL SULFATE HFA 108 (90 BASE) MCG/ACT IN AERS: 2.0000 | INHALATION_SPRAY | Freq: Four times a day (QID) | RESPIRATORY_TRACT | Status: AC | PRN

## 2014-07-10 NOTE — Progress Notes (Signed)
Subjective:    Patient ID: Danielle Blackburn, female    DOB: [birth year], 53 y.o.   MRN: [id number]  HPI  Danielle Blackburn is here today to discuss a couple of issues:  1)  Vitamin D Deficiency - She needs to have her Vitamin D refilled.    2)  Weight - She has been going to [employer] for several months. She has lost 15 lbs since her last visit in March.  According to [employer], her goal weight should be 135.  She feels that she would be too thin at that weight and feels that a good weight for her would be 165 lbs.  She needs a letter for this.    3)  Asthma - She needs a refill of her albuterol.     Review of Systems  Constitutional: Negative for activity change, appetite change, fatigue and unexpected weight change.  Cardiovascular: Negative for chest pain, palpitations and leg swelling.  All other systems reviewed and are negative.    Past Medical History  Diagnosis Date  . Cancer     Rectal Cancer (Well-Differentiated Adenocarcinoma) T1N0M0   . Colon polyps   . Asthma   . Vitamin D deficiency   . Iritis      Past Surgical History  Procedure Laterality Date  . Abdominal hysterectomy    . Knee surgery Right      History   Social History Narrative   Marital Status: Married Engineering geologist)   Children:  G3 P1/0/2/2   Lauren (2) Hart Carwin (13)   Pets: None    Living Situation: Lives with  spouse and children   Occupation: Medical sales representative -  Production designer, theatre/television/film    Education:  Forensic psychologist   Tobacco Use/Exposure:  None    Alcohol Use:  None   Drug Use:  None   Diet:  [employer]   Exercise:  [employer], Walking   Hobbies: Shopping     Family History  Problem Relation Age of Onset  . Hypertension Mother   . Diabetes Sister   . Heart disease Brother   . Hypertension Maternal Grandmother     No Known Allergies   Immunization History  Administered Date(s) Administered  . Tdap 02/08/2009       Objective:   Physical Exam  Vitals  reviewed. Constitutional: She is oriented to person, place, and time.  Eyes: Conjunctivae are normal. No scleral icterus.  Neck: Neck supple. No thyromegaly present.  Cardiovascular: Normal rate, regular rhythm and normal heart sounds.   Pulmonary/Chest: Effort normal and breath sounds normal.  Musculoskeletal: Normal range of motion.  Lymphadenopathy:    She has no cervical adenopathy.  Neurological: She is alert and oriented to person, place, and time.  Skin: Skin is warm and dry.  Psychiatric: She has a normal mood and affect. Her behavior is normal. Judgment and thought content normal.      Assessment & Plan:    Danielle Blackburn was seen today for medication refill.  Diagnoses and associated orders for this visit:  Wheezing - albuterol (PROVENTIL HFA;VENTOLIN HFA) 108 (90 BASE) MCG/ACT inhaler; Inhale 2 puffs into the lungs every 6 (six) hours as needed for wheezing or shortness of breath (cough, shortness of breath or wheezing.).  Obesity, unspecified Comments: She is doing well with her weight loss with [employer]. She will continue going there.    Unspecified vitamin D deficiency - Vitamin D, Ergocalciferol, (DRISDOL) 50000 UNITS CAPS capsule; Take 1 capsule (50,000 Units total) by mouth  2 (two) times a week.

## 2014-08-15 DIAGNOSIS — R062 Wheezing: Secondary | ICD-10-CM | POA: Insufficient documentation

## 2014-08-15 DIAGNOSIS — E559 Vitamin D deficiency, unspecified: Secondary | ICD-10-CM | POA: Insufficient documentation

## 2014-08-15 DIAGNOSIS — E669 Obesity, unspecified: Secondary | ICD-10-CM | POA: Insufficient documentation

## 2014-10-07 ENCOUNTER — Other Ambulatory Visit: Payer: Self-pay

## 2014-10-07 DIAGNOSIS — Z1239 Encounter for other screening for malignant neoplasm of breast: Secondary | ICD-10-CM

## 2014-10-30 ENCOUNTER — Other Ambulatory Visit: Payer: Self-pay

## 2014-10-30 ENCOUNTER — Ambulatory Visit
Admission: RE | Admit: 2014-10-30 | Discharge: 2014-10-30 | Disposition: A | Discharge status: Home / Self Care | Payer: BC Managed Care – PPO | Source: Ambulatory Visit

## 2014-10-30 DIAGNOSIS — Z1231 Encounter for screening mammogram for malignant neoplasm of breast: Secondary | ICD-10-CM

## 2016-01-25 IMAGING — MG MM DIGITAL SCREENING BILAT W/ CAD
4 series · 4 of 4 positions shown · non-contrast
Comparison: Previous exam(s).

CLINICAL DATA: Screening.

EXAM:
DIGITAL SCREENING BILATERAL MAMMOGRAM WITH CAD

[R CC]
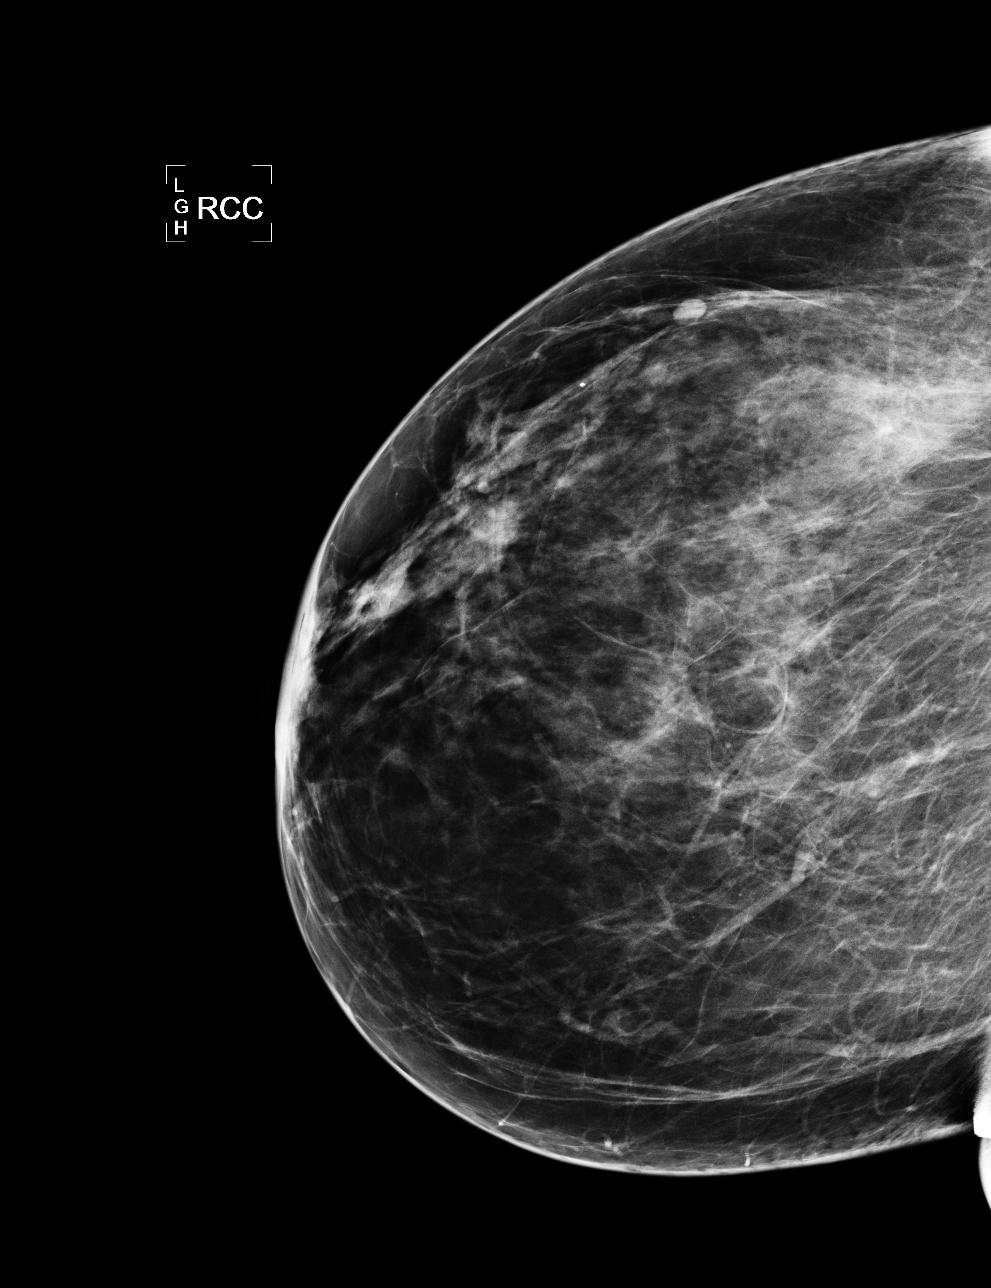

[L CC]
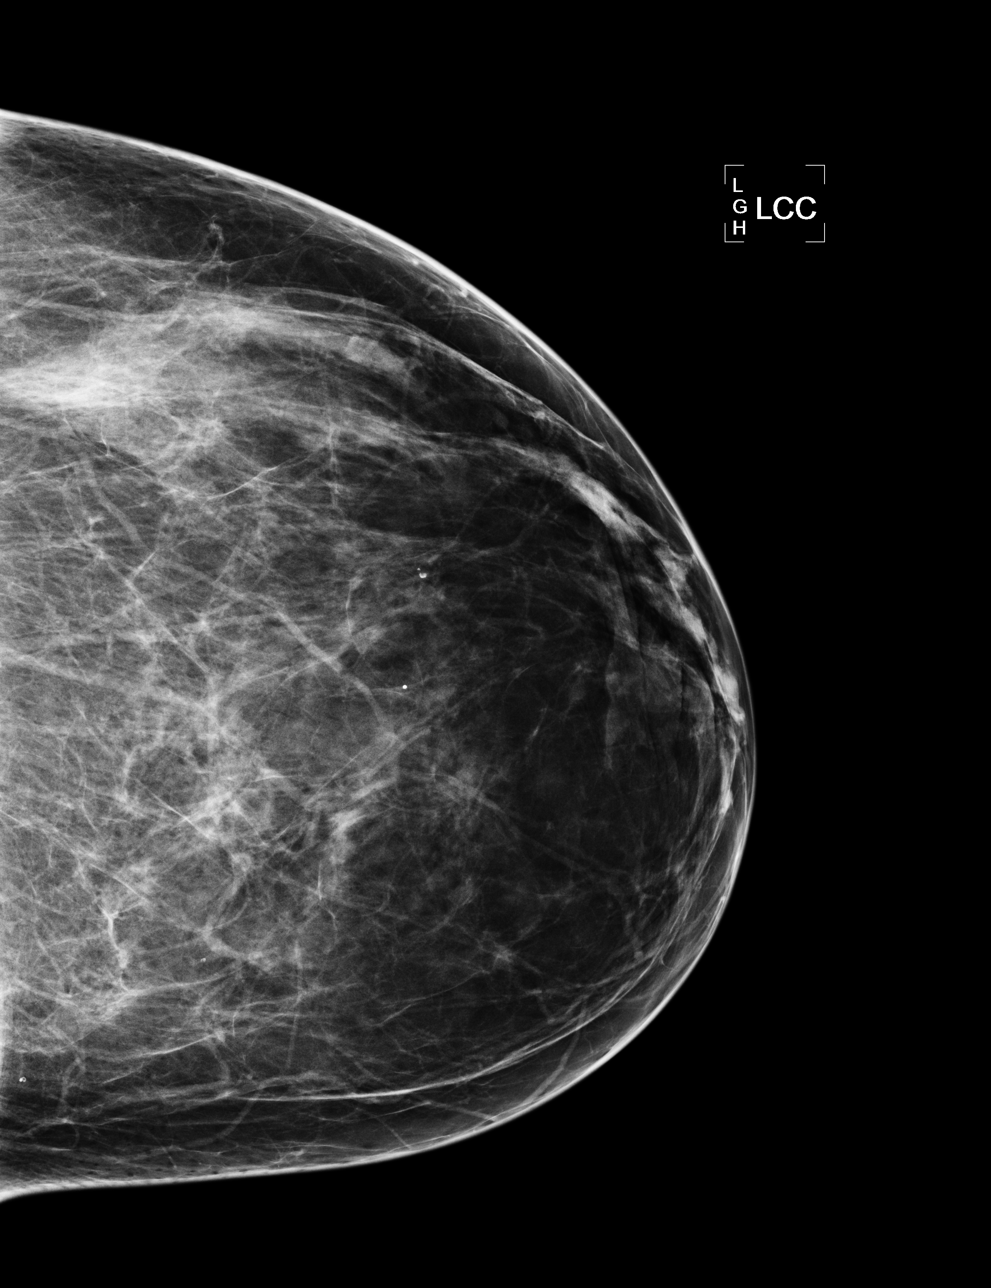

[L MLO]
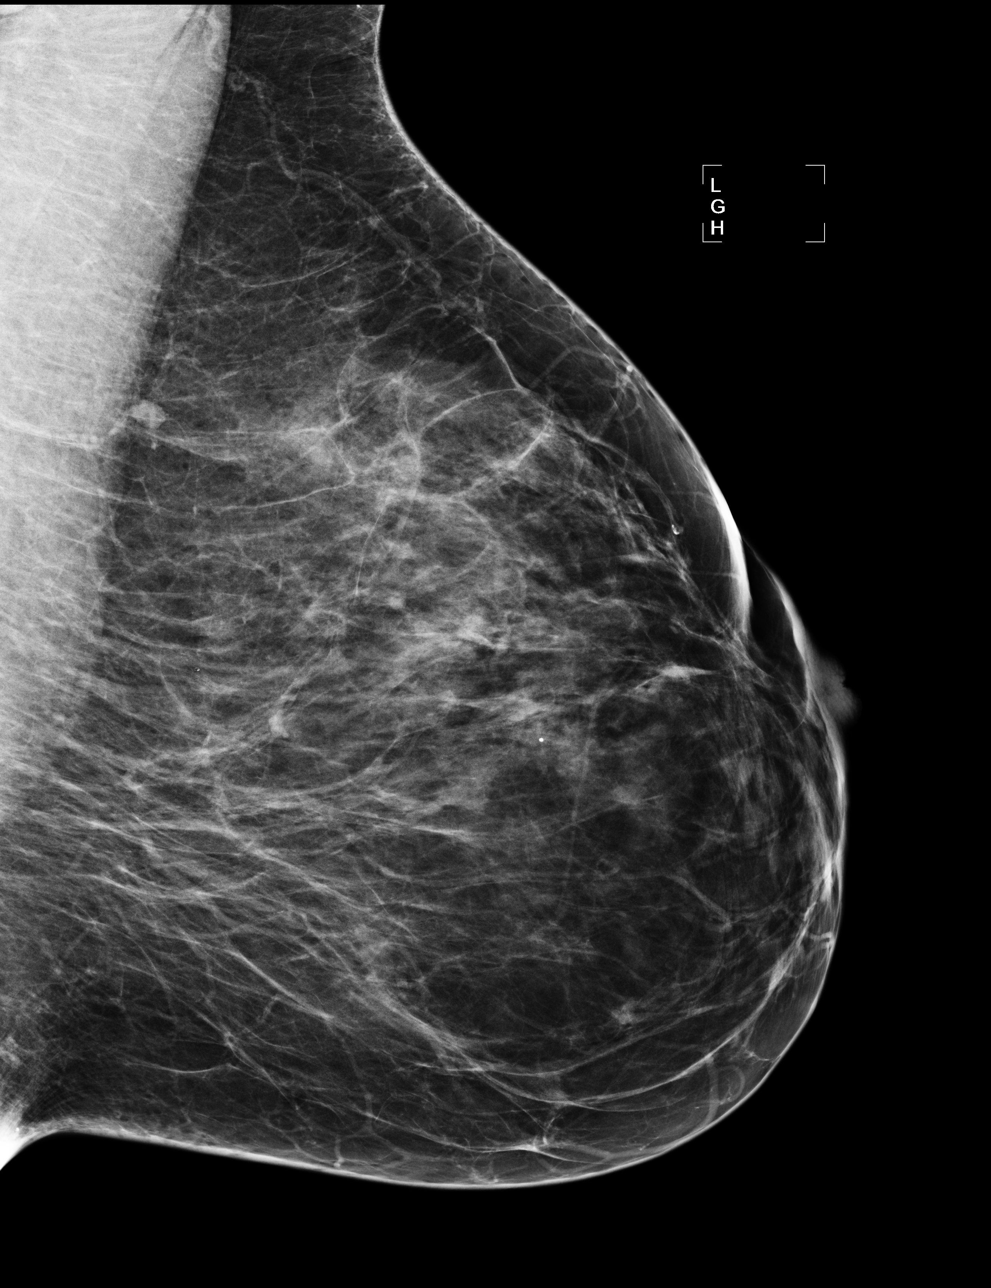

[R MLO]
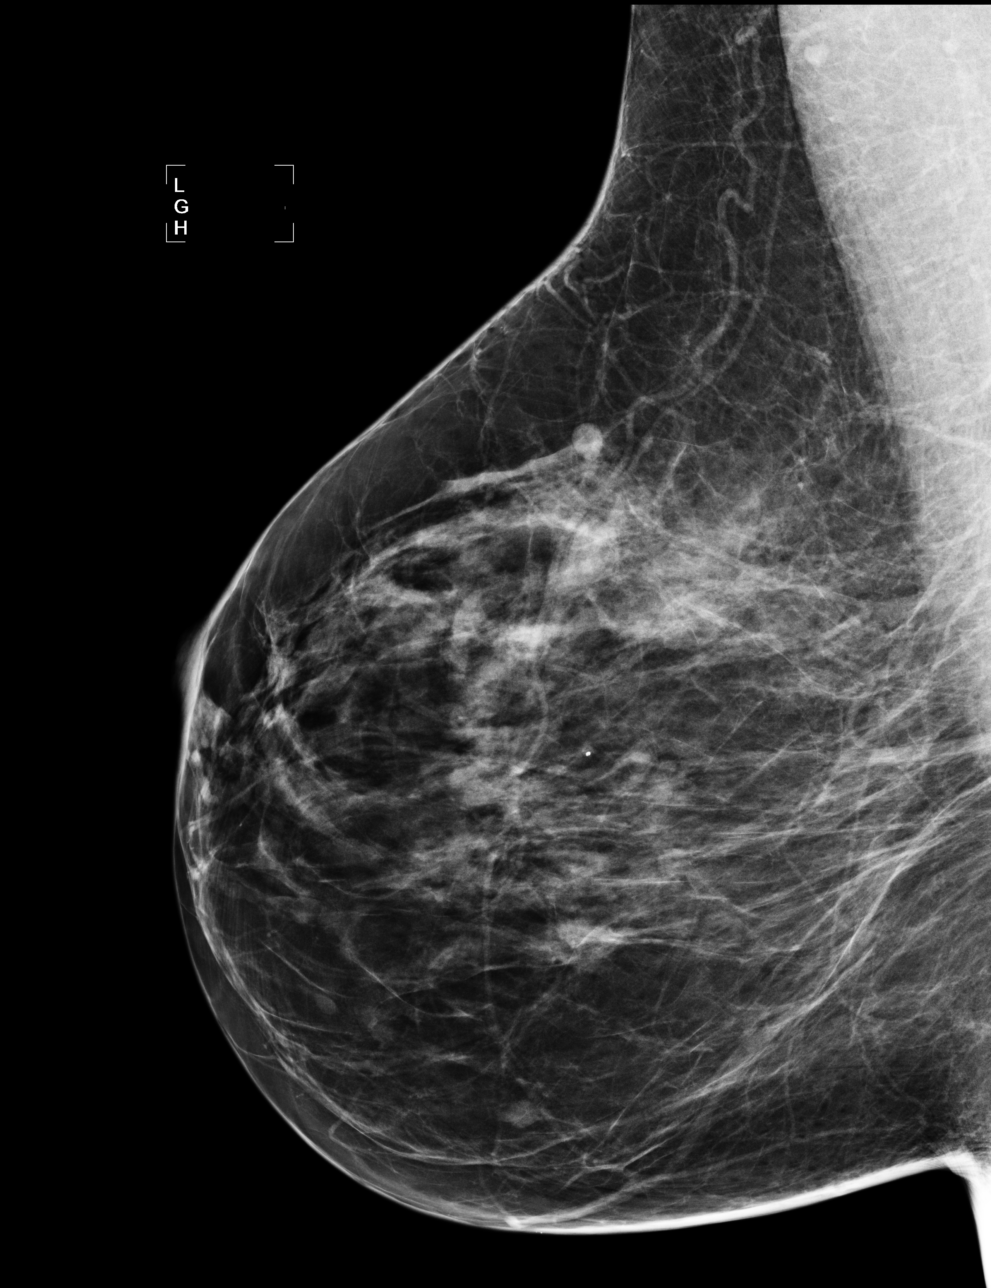

[4 of 4 positions shown; findings below may reference images not displayed]

ACR Breast Density Category b: There are scattered areas of
fibroglandular density.
FINDINGS: There are no findings suspicious for malignancy. Images were
processed with CAD.
IMPRESSION: No mammographic evidence of malignancy. A result letter of this
screening mammogram will be mailed directly to the patient.

RECOMMENDATION:
Screening mammogram in one year. (Code:AS-G-LCT)

BI-RADS CATEGORY  1: Negative.
